# Patient Record
Sex: Female | Born: 1945 | Race: Black or African American | Hispanic: No | Marital: Married | Smoking: Never smoker
Health system: Southern US, Academic
[De-identification: ages and names within clinical notes are randomized; demographics above are authoritative.]

## PROBLEM LIST (undated history)

## (undated) DIAGNOSIS — I749 Embolism and thrombosis of unspecified artery: Secondary | ICD-10-CM

## (undated) DIAGNOSIS — I519 Heart disease, unspecified: Secondary | ICD-10-CM

## (undated) DIAGNOSIS — I10 Essential (primary) hypertension: Secondary | ICD-10-CM

## (undated) DIAGNOSIS — E119 Type 2 diabetes mellitus without complications: Secondary | ICD-10-CM

## (undated) DIAGNOSIS — E079 Disorder of thyroid, unspecified: Secondary | ICD-10-CM

## (undated) HISTORY — DX: Heart disease, unspecified: I51.9

## (undated) HISTORY — DX: Embolism and thrombosis of unspecified artery (CMS HCC): I74.9

## (undated) HISTORY — DX: Essential (primary) hypertension: I10

## (undated) HISTORY — DX: Disorder of thyroid, unspecified: E07.9

## (undated) HISTORY — DX: Type 2 diabetes mellitus without complications (CMS HCC): E11.9

## (undated) HISTORY — PX: HX HERNIA REPAIR: SHX51

---

## 2010-07-15 ENCOUNTER — Other Ambulatory Visit (HOSPITAL_COMMUNITY): Payer: Self-pay | Admitting: PHYSICIAN ASSISTANT

## 2021-05-11 NOTE — H&P (Unsigned)
Department of Hematology/Oncology  History and Physical    Name: Anita Edwards  SWN:[id number]  Date of Birth: 1945/05/07  Encounter Date: 05/13/2021    REFERRING PROVIDER:  Carmell Austria, PA  [address]  [address]  [place],  [place] [address]    REASON FOR OFFICE VISIT:  A patient previously seen a [facility] for evaluation and management of  History of Pulmonary Embolism.    HISTORY OF PRESENT ILLNESS:  Anita Edwards is a 76 y.o. female who presents today for follow up for hypercoagulable state. The patient was hospitalized in February 2021 with COVID pneumonia and suffered bilateral PE's. She was placed on Eliquis but was later switched to Xarelto due to insurance coverage. 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  The patient has had a good appetite and stable weight.  The patient has not noted any new areas of disease on own self exam.  The patient has not had any chest pain or dyspnea.  The patient has not had any headaches or changes in vision.  The patient has not had any abdominal pain, nausea, or vomiting.  There have been no changes in bowel or bladder habits.  The patient has not had any abnormal bleeding or clotting episodes.  There have been no complaints of fever, chills, cough, sputum production, dysuria, or diarrhea.    ROS:   Review of Systems - Oncology     History:  No past medical history on file.  {Medical History Negative:25850}      {Past Surgical History Negative:25851}        Social History     Socioeconomic History   . Marital status: Not on file     Spouse name: Not on file   . Number of children: Not on file   . Years of education: Not on file   . Highest education level: Not on file   Occupational History   . Not on file   Tobacco Use   . Smoking status: Not on file   . Smokeless tobacco: Not on file   Substance and Sexual Activity    . Alcohol use: Not on file   . Drug use: Not on file   . Sexual activity: Not on file   Other Topics Concern   . Not on file   Social History Narrative   . Not on file     Social Determinants of Health     Financial Resource Strain: Not on file   Transportation Needs: Not on file   Social Connections: Not on file   Intimate Partner Violence: Not on file   Housing Stability: Not on file       Social History     Social History Narrative   . Not on file       Social History     Substance and Sexual Activity   Drug Use Not on file       Family Medical History:    None           No current outpatient medications on file.       Not on File      PHYSICAL EXAM:    ECOG Status: {findings; ecog performance status:31780}   Physical Exam     LABS:   Labs completed on 08/04/20: WBC 4.5, HGB 11.8, HCT 36.1, PLT 182,000.  ASSESSMENT:  No diagnosis found.       PLAN:   1. All relative external and internal medical records were reviewed including available H&Ps, progress notes, procedure notes, imaging's, laboratories, and pathology.   2. All labs from last visit were reviewed with the patient including CBC/differential, CMP. Details of exam finding's discussed.       Chiann Goffredo Keng was given the chance to ask questions, and these were answered to their satisfaction. The patient is welcome to call with any questions or concerns in the meantime.     On the day of the encounter, a total of  *** minutes was spent on this patient encounter including review of historical information, examination, documentation and post-visit activities.   No follow-ups on file.     Benjaman Pott, APRN, FNP-BC    CC:  No primary provider on file.    Carosi, [place], [place]  [address]  [address]  [place],  [place] [address]      This note was partially generated using MModal Fluency Direct system, and there may be some incorrect words, spellings, and punctuation that were not noted in checking the note before saving.

## 2021-05-13 ENCOUNTER — Other Ambulatory Visit: Payer: Self-pay

## 2021-05-13 ENCOUNTER — Encounter (INDEPENDENT_AMBULATORY_CARE_PROVIDER_SITE_OTHER): Payer: Self-pay

## 2021-05-13 ENCOUNTER — Ambulatory Visit: Payer: Medicare (Managed Care) | Attending: NURSE PRACTITIONER | Admitting: NURSE PRACTITIONER

## 2021-05-13 ENCOUNTER — Encounter (HOSPITAL_COMMUNITY): Payer: Self-pay | Admitting: NURSE PRACTITIONER

## 2021-05-13 VITALS — BP 130/78 | HR 77 | Temp 97.4°F | Ht 59.0 in | Wt 176.8 lb

## 2021-05-13 DIAGNOSIS — Z09 Encounter for follow-up examination after completed treatment for conditions other than malignant neoplasm: Secondary | ICD-10-CM | POA: Insufficient documentation

## 2021-05-13 DIAGNOSIS — Z7901 Long term (current) use of anticoagulants: Secondary | ICD-10-CM | POA: Insufficient documentation

## 2021-05-13 DIAGNOSIS — Z86711 Personal history of pulmonary embolism: Secondary | ICD-10-CM | POA: Insufficient documentation

## 2021-12-21 ENCOUNTER — Inpatient Hospital Stay
Admission: RE | Admit: 2021-12-21 | Discharge: 2021-12-21 | Disposition: A | Discharge status: Home / Self Care | Payer: Medicare (Managed Care) | Source: Ambulatory Visit | Attending: PHYSICIAN ASSISTANT | Admitting: PHYSICIAN ASSISTANT

## 2021-12-21 ENCOUNTER — Other Ambulatory Visit: Payer: Self-pay

## 2021-12-21 ENCOUNTER — Other Ambulatory Visit (HOSPITAL_COMMUNITY): Payer: Self-pay | Admitting: PHYSICIAN ASSISTANT

## 2021-12-21 DIAGNOSIS — R52 Pain, unspecified: Secondary | ICD-10-CM

## 2022-02-02 ENCOUNTER — Encounter (HOSPITAL_COMMUNITY): Payer: Self-pay | Admitting: NURSE PRACTITIONER

## 2022-02-02 ENCOUNTER — Other Ambulatory Visit: Payer: Self-pay

## 2022-02-02 ENCOUNTER — Ambulatory Visit: Payer: Medicare (Managed Care) | Attending: NURSE PRACTITIONER | Admitting: NURSE PRACTITIONER

## 2022-02-02 VITALS — BP 138/70 | HR 74 | Temp 97.5°F | Ht 59.0 in | Wt 181.9 lb

## 2022-02-02 DIAGNOSIS — I82412 Acute embolism and thrombosis of left femoral vein: Secondary | ICD-10-CM | POA: Insufficient documentation

## 2022-02-02 DIAGNOSIS — Z7901 Long term (current) use of anticoagulants: Secondary | ICD-10-CM | POA: Insufficient documentation

## 2022-02-02 DIAGNOSIS — I82402 Acute embolism and thrombosis of unspecified deep veins of left lower extremity: Secondary | ICD-10-CM

## 2022-02-02 DIAGNOSIS — Z7984 Long term (current) use of oral hypoglycemic drugs: Secondary | ICD-10-CM | POA: Insufficient documentation

## 2022-02-02 DIAGNOSIS — I82432 Acute embolism and thrombosis of left popliteal vein: Secondary | ICD-10-CM | POA: Insufficient documentation

## 2022-02-02 DIAGNOSIS — Z794 Long term (current) use of insulin: Secondary | ICD-10-CM | POA: Insufficient documentation

## 2022-02-02 DIAGNOSIS — E119 Type 2 diabetes mellitus without complications: Secondary | ICD-10-CM | POA: Insufficient documentation

## 2022-02-02 DIAGNOSIS — Z86711 Personal history of pulmonary embolism: Secondary | ICD-10-CM | POA: Insufficient documentation

## 2022-02-02 NOTE — Cancer Center Note (Signed)
Department of Hematology/Oncology  History and Physical    Name: Anita Edwards  PYK:[id number]  Date of Birth: 10-17-1945  Encounter Date: 02/02/2022    REFERRING PROVIDER:  Carmell Austria, PA  [address]  [address]  [place],  [place] [address]    REASON FOR OFFICE VISIT:  A patient previously seen a [facility] for evaluation and management of  History of Pulmonary Embolism.    HISTORY OF PRESENT ILLNESS:  Anita Edwards is a 76 y.o. female who presents today for follow up for hypercoagulable state. The patient was hospitalized in February 2021 with COVID pneumonia and suffered bilateral PE's. She was placed on Eliquis but was later switched to Xarelto due to insurance coverage. 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    05/13/2021: The patient states that she has been doing well and would like to stop taking her Xarelto at this time. She was instructed on the labs findings of the initial hypercoagulable state workup and that this along with her history of PE could increase her chances of having another blood clot. She states that she would like to come off the Xarelto and start aspirin daily at this time. She was instructed on other circumstances that could cause her to be at increase risk of blood clots and the symptoms to watch for.     02/02/2022: Patient followed up with her PCP 12/2021 after having let leg pain and swelling. Labs were obtained and revealed an elevated D-dimer. She was sent for a Venous Duplex on 12/21/21 which showed a DVT of left proximal superficial femoral vein and popliteal veins. She was started on Eliquis 5 mg BID.    ROS:   Review of Systems   Constitutional: Negative.  Negative for appetite change.   HENT:  Negative.     Eyes: Negative.    Respiratory: Negative.  Negative for shortness of breath.    Cardiovascular: Negative.  Negative for chest pain.   Gastrointestinal: Negative.   Negative for abdominal pain, diarrhea, nausea and vomiting.   Genitourinary: Negative.  Negative for difficulty urinating.    Musculoskeletal: Negative.    Skin: Negative.    Neurological: Negative.    Hematological: Negative.    Psychiatric/Behavioral: Negative.          History:  Past Medical History:   Diagnosis Date    Diabetes mellitus, type 2 (CMS HCC)     Disorder of thyroid     Embolism (CMS HCC)     Essential hypertension     Heart disease        Social History     Socioeconomic History    Marital status: Married     Spouse name: Not on file    Number of children: Not on file    Years of education: Not on file    Highest education level: Not on file   Occupational History    Not on file   Tobacco Use    Smoking status: Never    Smokeless tobacco: Never   Vaping Use    Vaping Use: Never used   Substance and Sexual Activity    Alcohol use: Never    Drug use: Never    Sexual activity: Not on file   Other Topics Concern    Not on file   Social History Narrative    Not on file     Social Determinants  of Health     Financial Resource Strain: Not on file   Transportation Needs: Not on file   Social Connections: Not on file   Intimate Partner Violence: Not on file   Housing Stability: Not on file       Social History     Social History Narrative    Not on file       Social History     Substance and Sexual Activity   Drug Use Never       Family Medical History:       Problem Relation (Age of Onset)    COPD Father    Diabetes Mother, Father    Heart Disease Mother          Current Outpatient Medications   Medication Sig    ascorbate calc/ascorbyl palm (EASY-C IMMUNE HEALTH ORAL) Take by mouth    Ca ph tri/vitamin D3/soybean (SOY CARE BONE HEALTH ORAL) Take by mouth    cholecalciferol, vitamin D3, 25 mcg (1,000 unit) Oral Tablet Take 1 Tablet (1,000 Units total) by mouth Once a day    cyanocobalamin (VITAMIN B 12) 1,000 mcg Oral Tablet Take 1 Tablet (1,000 mcg total) by mouth Once a day    ELIQUIS 5 mg Oral Tablet      glimepiride (AMARYL) 2 mg Oral Tablet Take 1 Tablet (2 mg total) by mouth Twice daily    hydroCHLOROthiazide (HYDRODIURIL) 25 mg Oral Tablet Take 1 Tablet (25 mg total) by mouth Once a day    insulin glargine 100 unit/mL Subcutaneous injection (vial)     latanoprost (XALATAN) 0.005 % Ophthalmic Drops Instill 1 Drop into both eyes Every evening    levothyroxine (SYNTHROID) 50 mcg Oral Tablet Take 1 Tablet (50 mcg total) by mouth Once a day    lisinopriL (PRINIVIL) 10 mg Oral Tablet Take 1 Tablet (10 mg total) by mouth Once a day    metFORMIN (GLUCOPHAGE) 500 mg Oral Tablet Take 2 Tablets (1,000 mg total) by mouth Twice daily    metoprolol succinate (TOPROL-XL) 25 mg Oral Tablet Sustained Release 24 hr Take 1 Tablet (25 mg total) by mouth Once a day    simvastatin (ZOCOR) 20 mg Oral Tablet Take 1 Tablet (20 mg total) by mouth Once a day       No Known Allergies      PHYSICAL EXAM:    ECOG Status: (2) Ambulatory and capable of self care, unable to carry out work activity, up and about > 50% or waking hours   Physical Exam  Vitals and nursing note reviewed.   Constitutional:       Appearance: Normal appearance.   HENT:      Head: Normocephalic.      Nose: Nose normal.      Mouth/Throat:      Mouth: Mucous membranes are moist.      Pharynx: Oropharynx is clear.   Eyes:      General: No scleral icterus.     Extraocular Movements: Extraocular movements intact.   Cardiovascular:      Rate and Rhythm: Normal rate and regular rhythm.      Pulses: Normal pulses.      Heart sounds: Normal heart sounds.   Pulmonary:      Effort: Pulmonary effort is normal.      Breath sounds: Normal breath sounds.   Abdominal:      General: Bowel sounds are normal.      Palpations: Abdomen is soft.   Musculoskeletal:  General: Normal range of motion.      Cervical back: Normal range of motion and neck supple.   Skin:     General: Skin is warm and dry.   Neurological:      General: No focal deficit present.      Mental Status: She is alert  and oriented to person, place, and time. Mental status is at baseline.   Psychiatric:         Mood and Affect: Mood normal.         Behavior: Behavior normal.          LABS:   Labs completed on 08/04/20: WBC 4.5, HGB 11.8, HCT 36.1, PLT 182,000.    ASSESSMENT:    ICD-10-CM    1. Deep vein thrombosis (DVT) of left lower extremity (CMS HCC)  I82.402       2. History of pulmonary embolus (PE)  Z86.711            PLAN:   1. All relative external and internal medical records were reviewed including available H&Ps, progress notes, procedure notes, imaging's, laboratories, and pathology.   2. All labs from last studies were reviewed with the patient including CBC/differential, CMP. Details of exam finding's discussed. She will continue to have her labs completed by her PCP.   3. Patient has requested to see if she can get assistance to help pay for her Eliquis.       Ronasia Isola Hlavac was given the chance to ask questions, and these were answered to their satisfaction. The patient is welcome to call with any questions or concerns in the meantime.     On the day of the encounter, a total of  30 minutes was spent on this patient encounter including review of historical information, examination, documentation and post-visit activities.   Return in about 3 months (around 05/04/2022).   Lyna Laningham, APRN,FNP-BC ,02/02/2022 ,14:54     CC:  Jamie Carosi, PA  [address]  [place] [address]    Carosi, [place], [place]  [address]  [address]  [place],  [place] [address]      This note was partially generated using MModal Fluency Direct system, and there may be some incorrect words, spellings, and punctuation that were not noted in checking the note before saving.

## 2022-05-04 ENCOUNTER — Encounter (HOSPITAL_COMMUNITY): Payer: Self-pay | Admitting: NURSE PRACTITIONER

## 2022-05-04 ENCOUNTER — Other Ambulatory Visit: Payer: Self-pay

## 2022-05-04 ENCOUNTER — Ambulatory Visit: Payer: Medicare (Managed Care) | Attending: NURSE PRACTITIONER | Admitting: NURSE PRACTITIONER

## 2022-05-04 VITALS — BP 125/60 | HR 79 | Temp 98.5°F | Ht 59.0 in | Wt 181.9 lb

## 2022-05-04 DIAGNOSIS — I82402 Acute embolism and thrombosis of unspecified deep veins of left lower extremity: Secondary | ICD-10-CM | POA: Insufficient documentation

## 2022-05-04 DIAGNOSIS — Z86711 Personal history of pulmonary embolism: Secondary | ICD-10-CM | POA: Insufficient documentation

## 2022-05-04 DIAGNOSIS — D6859 Other primary thrombophilia: Secondary | ICD-10-CM | POA: Insufficient documentation

## 2022-05-04 DIAGNOSIS — Z7901 Long term (current) use of anticoagulants: Secondary | ICD-10-CM | POA: Insufficient documentation

## 2022-05-04 NOTE — Cancer Center Note (Signed)
Department of Hematology/Oncology  History and Physical    Name: Anita Edwards  [id number]  Date of Birth: 19-Sep-1945  Encounter Date: 05/04/2022    REFERRING PROVIDER:  Bary Leriche, [place]  [place],  [place] [address]    REASON FOR OFFICE VISIT:  A patient previously seen a [facility] for evaluation and management of  History of Pulmonary Embolism.    HISTORY OF PRESENT ILLNESS:  Anita Edwards is a 77 y.o. female who presents today for follow up for hypercoagulable state. The patient was hospitalized in February 2021 with COVID pneumonia and suffered bilateral PE's. She was placed on Eliquis but was later switched to Xarelto due to insurance coverage. 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    05/13/2021: The patient states that she has been doing well and would like to stop taking her Xarelto at this time. She was instructed on the labs findings of the initial hypercoagulable state workup and that this along with her history of PE could increase her chances of having another blood clot. She states that she would like to come off the Xarelto and start aspirin daily at this time. She was instructed on other circumstances that could cause her to be at increase risk of blood clots and the symptoms to watch for.     02/02/2022: Patient followed up with her PCP 12/2021 after having let leg pain and swelling. Labs were obtained and revealed an elevated D-dimer. She was sent for a Venous Duplex on 12/21/21 which showed a DVT of left proximal superficial femoral vein and popliteal veins. She was started on Eliquis 5 mg BID.    ROS:   Review of Systems   Constitutional: Negative.  Negative for appetite change.   HENT:  Negative.     Eyes: Negative.    Respiratory: Negative.  Negative for shortness of breath.    Cardiovascular: Negative.  Negative for chest pain.   Gastrointestinal: Negative.   Negative for abdominal pain, diarrhea, nausea and vomiting.   Genitourinary: Negative.  Negative for difficulty urinating.    Musculoskeletal: Negative.    Skin: Negative.    Neurological: Negative.    Hematological: Negative.    Psychiatric/Behavioral: Negative.          History:  Past Medical History:   Diagnosis Date    Diabetes mellitus, type 2 (CMS HCC)     Disorder of thyroid     Embolism (CMS HCC)     Essential hypertension     Heart disease        Social History     Socioeconomic History    Marital status: Married     Spouse name: Not on file    Number of children: Not on file    Years of education: Not on file    Highest education level: Not on file   Occupational History    Not on file   Tobacco Use    Smoking status: Never    Smokeless tobacco: Never   Vaping Use    Vaping status: Never Used   Substance and Sexual Activity    Alcohol use: Never    Drug use: Never    Sexual activity: Not on file   Other Topics Concern    Not on file   Social History Narrative    Not on file     Social Determinants  of Health     Financial Resource Strain: Not on file   Transportation Needs: Not on file   Social Connections: Not on file   Intimate Partner Violence: Not on file   Housing Stability: Not on file       Social History     Social History Narrative    Not on file       Social History     Substance and Sexual Activity   Drug Use Never       Family Medical History:       Problem Relation (Age of Onset)    COPD Father    Diabetes Mother, Father    Heart Disease Mother          Current Outpatient Medications   Medication Sig    alendronate (FOSAMAX) 70 mg Oral Tablet     ascorbate calc/ascorbyl palm (EASY-C IMMUNE HEALTH ORAL) Take by mouth    Ca ph tri/vitamin D3/soybean (SOY CARE BONE HEALTH ORAL) Take by mouth    cholecalciferol, vitamin D3, 25 mcg (1,000 unit) Oral Tablet Take 1 Tablet (1,000 Units total) by mouth Once a day    cyanocobalamin (VITAMIN B 12) 1,000 mcg Oral Tablet Take 1 Tablet (1,000 mcg total) by  mouth Once a day    ELIQUIS 5 mg Oral Tablet     glimepiride (AMARYL) 2 mg Oral Tablet Take 1 Tablet (2 mg total) by mouth Twice daily    hydroCHLOROthiazide (HYDRODIURIL) 25 mg Oral Tablet Take 1 Tablet (25 mg total) by mouth Once a day    insulin glargine 100 unit/mL Subcutaneous injection (vial)     latanoprost (XALATAN) 0.005 % Ophthalmic Drops Instill 1 Drop into both eyes Every evening    levothyroxine (SYNTHROID) 50 mcg Oral Tablet Take 1 Tablet (50 mcg total) by mouth Once a day    lisinopriL (PRINIVIL) 10 mg Oral Tablet Take 1 Tablet (10 mg total) by mouth Once a day    metFORMIN (GLUCOPHAGE) 500 mg Oral Tablet Take 2 Tablets (1,000 mg total) by mouth Twice daily    metoprolol succinate (TOPROL-XL) 25 mg Oral Tablet Sustained Release 24 hr Take 1 Tablet (25 mg total) by mouth Once a day    simvastatin (ZOCOR) 20 mg Oral Tablet Take 1 Tablet (20 mg total) by mouth Once a day       No Known Allergies      PHYSICAL EXAM:    ECOG Status: (2) Ambulatory and capable of self care, unable to carry out work activity, up and about > 50% or waking hours   Physical Exam  Vitals and nursing note reviewed.   Constitutional:       Appearance: Normal appearance.   HENT:      Head: Normocephalic.      Nose: Nose normal.      Mouth/Throat:      Mouth: Mucous membranes are moist.      Pharynx: Oropharynx is clear.   Eyes:      General: No scleral icterus.     Extraocular Movements: Extraocular movements intact.   Cardiovascular:      Rate and Rhythm: Normal rate and regular rhythm.      Pulses: Normal pulses.      Heart sounds: Normal heart sounds.   Pulmonary:      Effort: Pulmonary effort is normal.      Breath sounds: Normal breath sounds.   Abdominal:      General: Bowel sounds are normal.  Palpations: Abdomen is soft.   Musculoskeletal:         General: Normal range of motion.      Cervical back: Normal range of motion and neck supple.   Skin:     General: Skin is warm and dry.   Neurological:      General: No focal  deficit present.      Mental Status: She is alert and oriented to person, place, and time. Mental status is at baseline.   Psychiatric:         Mood and Affect: Mood normal.         Behavior: Behavior normal.          LABS:                 ASSESSMENT:    ICD-10-CM    1. History of pulmonary embolus (PE)  Z86.711       2. Deep vein thrombosis (DVT) of left lower extremity (CMS HCC)  I82.402              PLAN:   The patient will continue to have her labs completed by her PCP.  Patient will continue her Eliquis as ordered. She will follow up in 3 months or sooner if needed.       Ishea Oppliger Kalmar was given the chance to ask questions, and these were answered to their satisfaction. The patient is welcome to call with any questions or concerns in the meantime.     On the day of the encounter, a total of  26 minutes was spent on this patient encounter including review of historical information, examination, documentation and post-visit activities.   Return in about 3 months (around 08/04/2022).   Rylend Pietrzak, APRN,FNP-BC ,05/04/2022 ,14:15     CC:  Jamie Carosi, [place]  [address]  [place] [address]    Carosi, [place], [place]  [place],  [place] [address]      This note was partially generated using MModal Fluency Direct system, and there may be some incorrect words, spellings, and punctuation that were not noted in checking the note before saving.

## 2022-08-16 ENCOUNTER — Telehealth (INDEPENDENT_AMBULATORY_CARE_PROVIDER_SITE_OTHER): Payer: Self-pay | Admitting: NURSE PRACTITIONER

## 2022-08-16 NOTE — Telephone Encounter (Signed)
Called patient and rescheduled appt to 7-30 8:30 lab and  9:00 doctor.

## 2022-08-17 ENCOUNTER — Ambulatory Visit (INDEPENDENT_AMBULATORY_CARE_PROVIDER_SITE_OTHER): Payer: Self-pay

## 2022-08-17 ENCOUNTER — Ambulatory Visit (INDEPENDENT_AMBULATORY_CARE_PROVIDER_SITE_OTHER): Payer: Self-pay | Admitting: NURSE PRACTITIONER

## 2022-09-06 NOTE — Cancer Center Note (Signed)
Department of Hematology/Oncology  History and Physical    Name: Anita Edwards  ZOX:[id number]  Date of Birth: 17-Aug-1945  Encounter Date: 09/07/2022    REFERRING PROVIDER:  Carmell Austria, PA  [address]  [address]  [place],  [place] [address]    REASON FOR OFFICE VISIT:  Follow up for evaluation and management of  History of Pulmonary Embolism.    HISTORY OF PRESENT ILLNESS:  Anita Edwards is a 77 y.o. female who presents today for follow up for hypercoagulable state. The patient was hospitalized in February 2021 with COVID pneumonia and suffered bilateral PE's. She was placed on Eliquis but was later switched to Xarelto due to insurance coverage. 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    05/13/2021: The patient states that she has been doing well and would like to stop taking her Xarelto at this time. She was instructed on the labs findings of the initial hypercoagulable state workup and that this along with her history of PE could increase her chances of having another blood clot. She states that she would like to come off the Xarelto and start aspirin daily at this time. She was instructed on other circumstances that could cause her to be at increase risk of blood clots and the symptoms to watch for.     02/02/2022: Patient followed up with her PCP 12/2021 after having let leg pain and swelling. Labs were obtained and revealed an elevated D-dimer. She was sent for a Venous Duplex on 12/21/21 which showed a DVT of left proximal superficial femoral vein and popliteal veins. She was started on Eliquis 5 mg BID.    09/07/2022: The patient is here today for follow up for his hypercoagulable state. She continues on Eliquis 5 mg BID ad tolerating this well. The patient states that she is having some increased weakness in both lower extremities. She denies any other complaints or issues at this time. She is having her Eliquis filled  and labs completed by her PCP and has asked if she can just follow up with them. We have helped fill out free drug paperwork for her Eliquis and they have asked about this since they have not heard anything from the company. She is agreeable to following up on a yearly basis and will notify us if needs to be seen sooner.     ROS:   Review of Systems   Constitutional: Negative.  Negative for appetite change.   HENT:  Negative.     Eyes: Negative.    Respiratory: Negative.  Negative for shortness of breath.    Cardiovascular: Negative.  Negative for chest pain.   Gastrointestinal: Negative.  Negative for abdominal pain, diarrhea, nausea and vomiting.   Genitourinary: Negative.  Negative for difficulty urinating.    Skin: Negative.    Neurological:  Positive for extremity weakness (bilateral lower extremities).   Hematological: Negative.    Psychiatric/Behavioral: Negative.          History:  Past Medical History:   Diagnosis Date    Diabetes mellitus, type 2 (CMS HCC)     Disorder of thyroid     Embolism (CMS HCC)     Essential hypertension     Heart disease        Social History     Socioeconomic History    Marital status: Married     Spouse name: Not on file  Number of children: Not on file    Years of education: Not on file    Highest education level: Not on file   Occupational History    Not on file   Tobacco Use    Smoking status: Never    Smokeless tobacco: Never   Vaping Use    Vaping status: Never Used   Substance and Sexual Activity    Alcohol use: Never    Drug use: Never    Sexual activity: Not on file   Other Topics Concern    Not on file   Social History Narrative    Not on file     Social Determinants of Health     Financial Resource Strain: Not on file   Transportation Needs: Not on file   Social Connections: Not on file   Intimate Partner Violence: Not on file   Housing Stability: Not on file       Social History     Social History Narrative    Not on file       Social History     Substance and  Sexual Activity   Drug Use Never       Family Medical History:       Problem Relation (Age of Onset)    COPD Father    Diabetes Mother, Father    Heart Disease Mother          Current Outpatient Medications   Medication Sig    alendronate (FOSAMAX) 70 mg Oral Tablet     ascorbate calc/ascorbyl palm (EASY-C IMMUNE HEALTH ORAL) Take by mouth    Ca ph tri/vitamin D3/soybean (SOY CARE BONE HEALTH ORAL) Take by mouth    cholecalciferol, vitamin D3, 25 mcg (1,000 unit) Oral Tablet Take 1 Tablet (1,000 Units total) by mouth Once a day    cyanocobalamin (VITAMIN B 12) 1,000 mcg Oral Tablet Take 1 Tablet (1,000 mcg total) by mouth Once a day    ELIQUIS 5 mg Oral Tablet     glimepiride (AMARYL) 2 mg Oral Tablet Take 1 Tablet (2 mg total) by mouth Twice daily    hydroCHLOROthiazide (HYDRODIURIL) 25 mg Oral Tablet Take 1 Tablet (25 mg total) by mouth Once a day    insulin glargine 100 unit/mL Subcutaneous injection (vial)     latanoprost (XALATAN) 0.005 % Ophthalmic Drops Instill 1 Drop into both eyes Every evening    levothyroxine (SYNTHROID) 50 mcg Oral Tablet Take 1 Tablet (50 mcg total) by mouth Once a day    lisinopriL (PRINIVIL) 10 mg Oral Tablet Take 1 Tablet (10 mg total) by mouth Once a day    metFORMIN (GLUCOPHAGE) 500 mg Oral Tablet Take 2 Tablets (1,000 mg total) by mouth Twice daily    metoprolol succinate (TOPROL-XL) 25 mg Oral Tablet Sustained Release 24 hr Take 1 Tablet (25 mg total) by mouth Once a day    simvastatin (ZOCOR) 20 mg Oral Tablet Take 1 Tablet (20 mg total) by mouth Once a day       No Known Allergies      PHYSICAL EXAM:    ECOG Status: (2) Ambulatory and capable of self care, unable to carry out work activity, up and about > 50% or waking hours   Physical Exam  Vitals and nursing note reviewed.   Constitutional:       Appearance: Normal appearance.   HENT:      Head: Normocephalic.      Nose: Nose normal.  Mouth/Throat:      Mouth: Mucous membranes are moist.      Pharynx: Oropharynx is clear.    Eyes:      General: No scleral icterus.     Extraocular Movements: Extraocular movements intact.   Cardiovascular:      Rate and Rhythm: Normal rate and regular rhythm.      Pulses: Normal pulses.      Heart sounds: Normal heart sounds.   Pulmonary:      Effort: Pulmonary effort is normal.      Breath sounds: Normal breath sounds.   Abdominal:      General: Bowel sounds are normal.      Palpations: Abdomen is soft.   Musculoskeletal:         General: Normal range of motion.      Cervical back: Normal range of motion and neck supple.   Skin:     General: Skin is warm and dry.   Neurological:      General: No focal deficit present.      Mental Status: She is alert and oriented to person, place, and time. Mental status is at baseline.   Psychiatric:         Mood and Affect: Mood normal.         Behavior: Behavior normal.          LABS:                     ASSESSMENT:    ICD-10-CM    1. History of pulmonary embolus (PE)  Z86.711       2. Deep vein thrombosis (DVT) of left lower extremity (CMS HCC)  I82.402                PLAN:   The patient will continue to have her labs completed by her PCP.  Patient will continue her Eliquis as ordered. She will follow up in 1 year or sooner if needed.       Shakeya Raudales Nan was given the chance to ask questions, and these were answered to their satisfaction. The patient is welcome to call with any questions or concerns in the meantime.     On the day of the encounter, a total of  30 minutes was spent on this patient encounter including review of historical information, examination, documentation and post-visit activities.   Return in about 1 year (around 09/07/2023).   Courteny Egler, APRN,FNP-BC ,09/07/2022 ,10:13     CC:  Jamie Carosi, PA  [address]  [place] [id number]    Carosi, [place], [place]  [address]  [address]  [place],  [place] [address]      This note was partially generated using MModal Fluency Direct system, and there may be some incorrect words, spellings,  and punctuation that were not noted in checking the note before saving.

## 2022-09-07 ENCOUNTER — Ambulatory Visit: Payer: Medicare (Managed Care) | Attending: NURSE PRACTITIONER | Admitting: NURSE PRACTITIONER

## 2022-09-07 ENCOUNTER — Inpatient Hospital Stay (INDEPENDENT_AMBULATORY_CARE_PROVIDER_SITE_OTHER): Admission: RE | Admit: 2022-09-07 | Payer: Medicare (Managed Care) | Source: Ambulatory Visit

## 2022-09-07 ENCOUNTER — Encounter (INDEPENDENT_AMBULATORY_CARE_PROVIDER_SITE_OTHER): Payer: Self-pay | Admitting: NURSE PRACTITIONER

## 2022-09-07 ENCOUNTER — Other Ambulatory Visit: Payer: Self-pay

## 2022-09-07 VITALS — BP 145/67 | HR 71 | Temp 98.0°F | Ht 59.0 in | Wt 179.2 lb

## 2022-09-07 DIAGNOSIS — Z7901 Long term (current) use of anticoagulants: Secondary | ICD-10-CM | POA: Insufficient documentation

## 2022-09-07 DIAGNOSIS — I82402 Acute embolism and thrombosis of unspecified deep veins of left lower extremity: Secondary | ICD-10-CM

## 2022-09-07 DIAGNOSIS — Z86718 Personal history of other venous thrombosis and embolism: Secondary | ICD-10-CM | POA: Insufficient documentation

## 2022-09-07 DIAGNOSIS — Z86711 Personal history of pulmonary embolism: Secondary | ICD-10-CM | POA: Insufficient documentation

## 2022-09-07 DIAGNOSIS — R531 Weakness: Secondary | ICD-10-CM | POA: Insufficient documentation

## 2022-09-07 DIAGNOSIS — Z09 Encounter for follow-up examination after completed treatment for conditions other than malignant neoplasm: Secondary | ICD-10-CM | POA: Insufficient documentation

## 2022-10-19 IMAGING — MR MRI LUMBAR SPINE WITHOUT CONTRAST
4 of 6 series · 29 of 48 positions shown · IV contrast (gadolinium)
Comparison: None available.

﻿EXAM:  [id number]   MRI LUMBAR SPINE WITHOUT CONTRAST
INDICATION: 76-year-old with chronic severe low back pain.  Bilateral hip pain.  No history of malignancy or back surgery.
TECHNIQUE: Multiplanar multisequential MRI of the lumbosacral spine was performed without gadolinium contrast.

[Series 5: T2 · sagittal · 4.0mm · 0.94mm/px · 6 of 13 slices shown (1 of 3)]
[im 1/13]
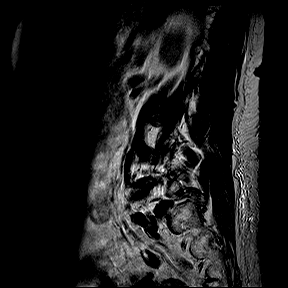
[im 3/13]
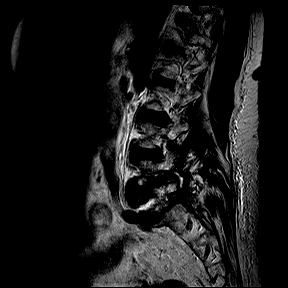
[im 5/13]
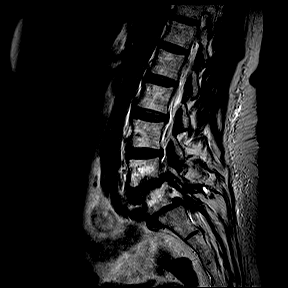
[im 8/13]
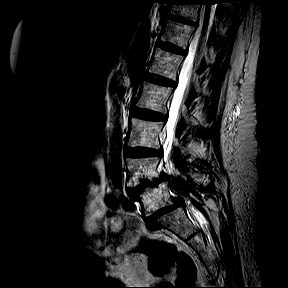
[im 10/13]
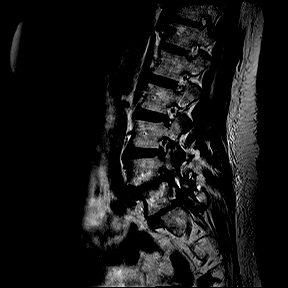
[im 13/13]
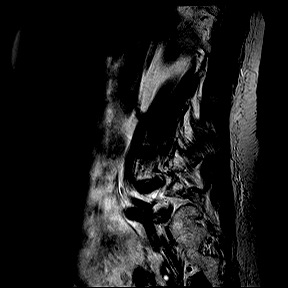

[Series 6: T1 · sagittal · 4.0mm · 0.94mm/px · 6 of 13 slices shown]
[im 1/13]
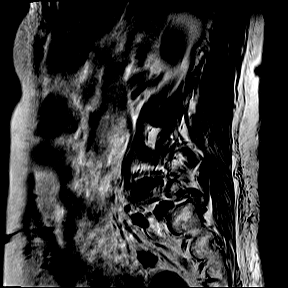
[im 3/13]
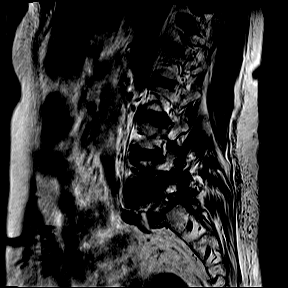
[im 5/13]
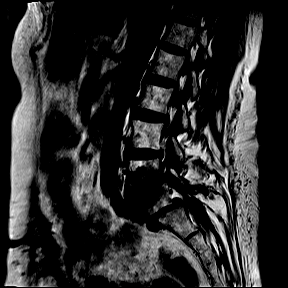
[im 8/13]
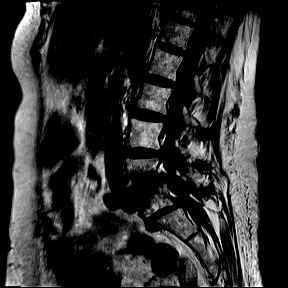
[im 10/13]
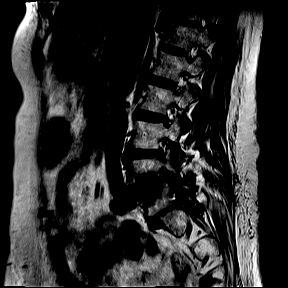
[im 13/13]
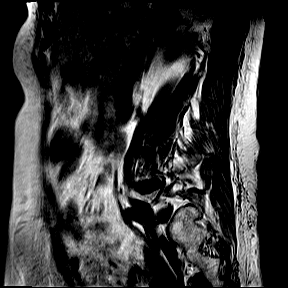

[Series 8: T2 · coronal · 5.0mm · 0.82mm/px · 9 of 18 slices shown (2 of 3)]
[im 1/18]
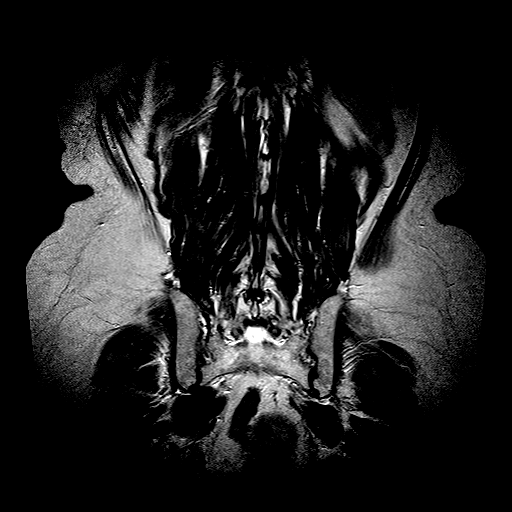
[im 3/18]
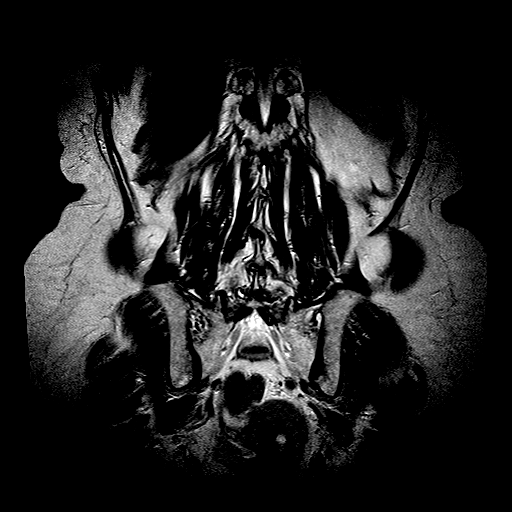
[im 5/18]
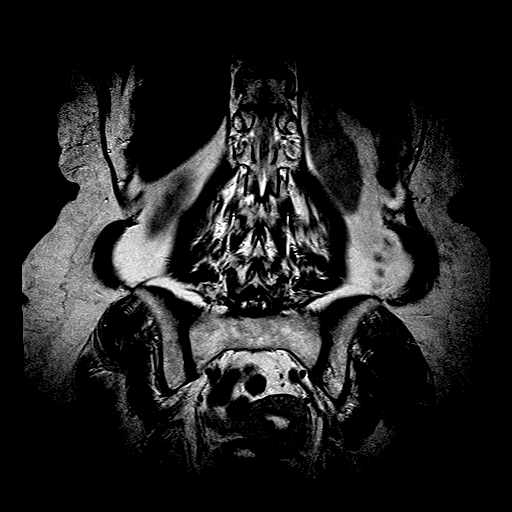
[im 7/18]
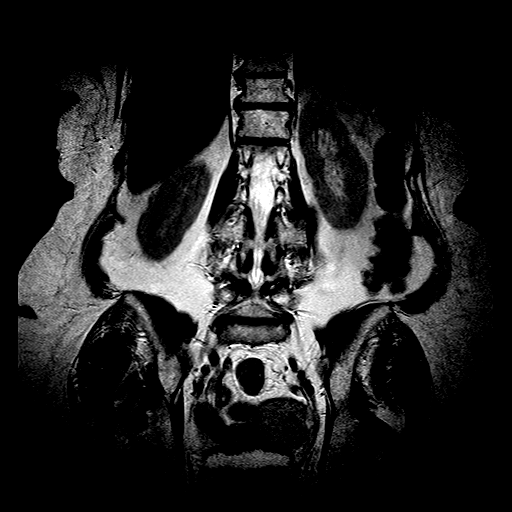
[im 9/18]
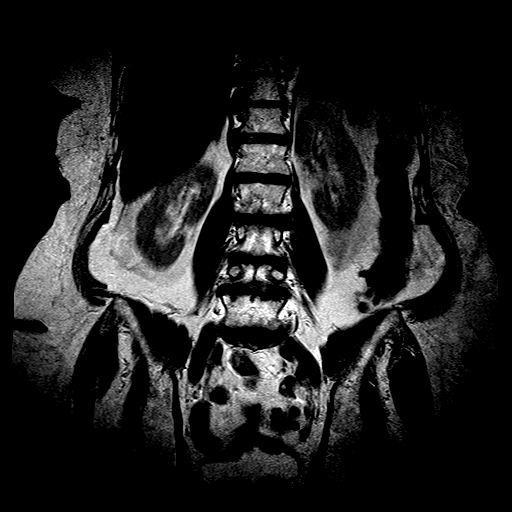
[im 11/18]
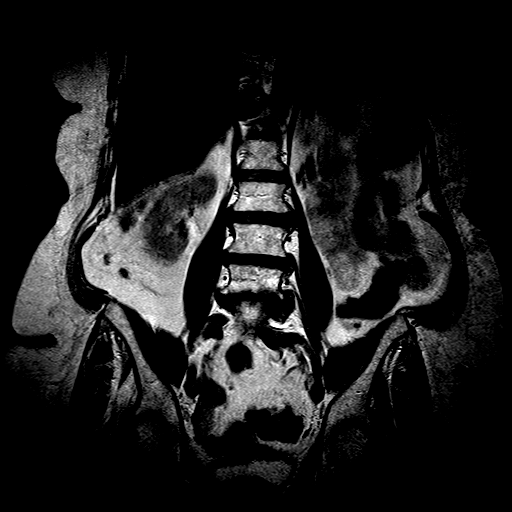
[im 13/18]
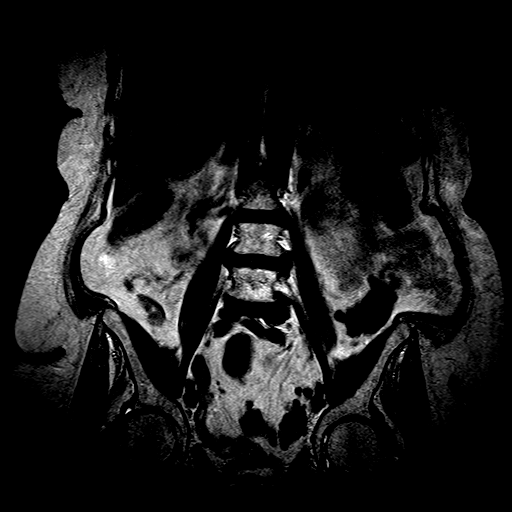
[im 15/18]
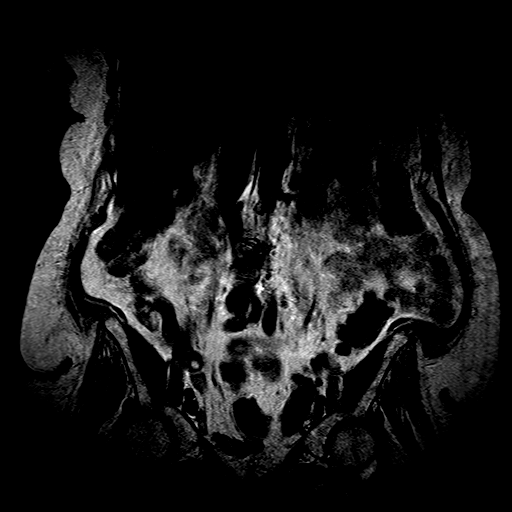
[im 18/18]
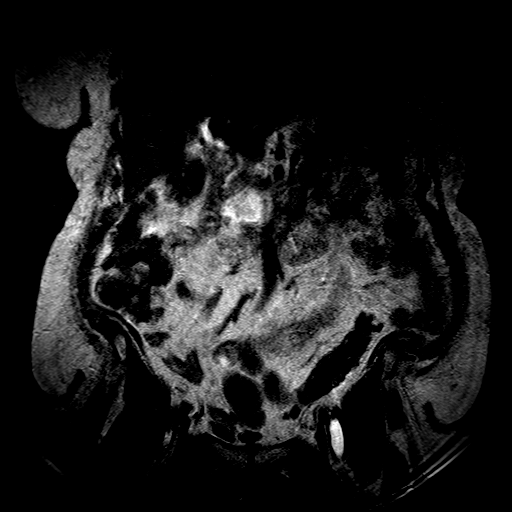

[Series 9: T2 · axial · 4.0mm · 0.52mm/px · z∈[-92,+141]mm · 8 of 19 slices shown (3 of 3)]
[im 1/19]
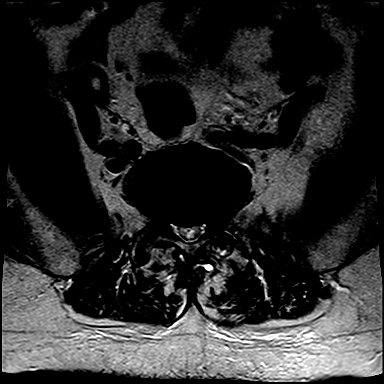
[im 3/19]
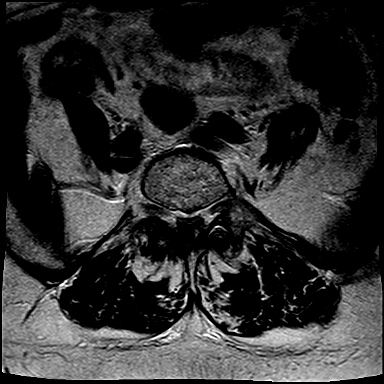
[im 7/19]
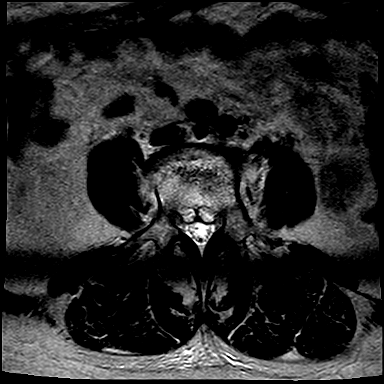
[im 9/19]
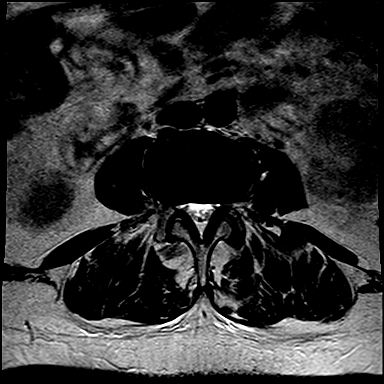
[im 11/19]
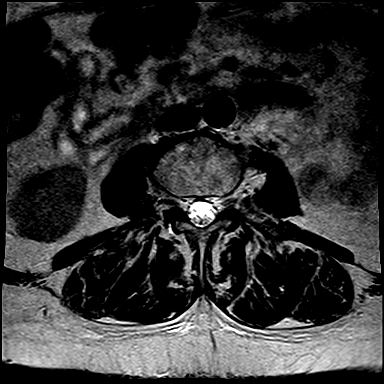
[im 13/19]
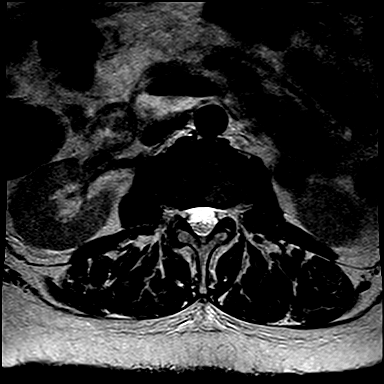
[im 17/19]
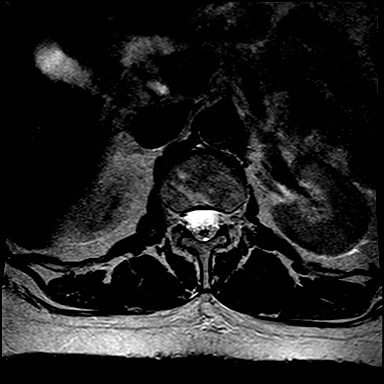
[im 19/19]
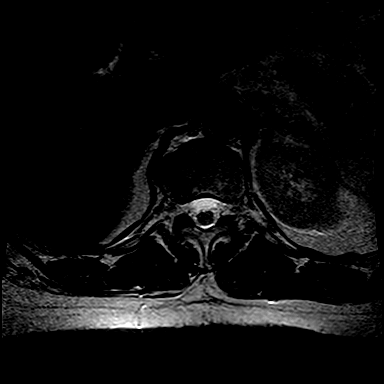

[29 of 48 positions shown; findings below may reference images not displayed]

FINDINGS: Transitional lumbosacral junction is noted with the lowest visible full disc in the sagittal projection labeled L5-S1 for the purposes of this report.

Severe Modic inflammatory changes on both sides of L4-5 disc is noted.  No other focal bone changes are seen. 

Conus terminates at L1-2 level. Cauda structures are normal. 

At L1-2 level, no focal disc lesions are seen.

At L2-3 level, mild degenerative disc changes and facet arthropathy are causing mild compromise of both lateral recess. 

At L3-4 level, degenerative disc disease and facet arthropathy of significant degree with hypertrophy of ligaments is causing moderately significant compromise of both lateral recess and neural foramina. Mild compromise of thecal sac in the midline with AP diameter measuring 10 mm.

At L4-5 level, severe bilateral facet arthropathy and severe degenerative disc disease is noted along with first-degree spondylolisthesis of L4 on L5.  Combination of these changes are causing severe compromise of lateral recess and neural foramina on both sides at this level and significant compromise of thecal sac with AP diameter in the midline measuring 6.5 mm.  Severe compromise of thecal sac at mid L5 level is also noted due to dorsal ligament hypertrophy.

At L5-S1 level, degenerative disc disease and facet arthropathy are causing severe biforaminal narrowing.  Mild first-degree spondylolisthesis of L5 on S1 is noted.
IMPRESSION: 1. Transitional lumbosacral junction. Lowest full disc in the sagittal projection is labeled L5-S1 for the purposes of this report.  Any surgical procedure on this patient's lumbar spine must be preceded by confirmation of disc level.  Severe Modic inflammatory changes on both sides of L4-5 disc is noted.

2. At L4-5 level, severe bilateral facet arthropathy and severe degenerative disc disease is noted along with first-degree spondylolisthesis of L4 on L5.  Combination of these changes are causing severe compromise of lateral recess and neural foramina on both sides at this level and significant compromise of thecal sac with AP diameter in the midline measuring 6.5 mm.  Severe compromise of thecal sac at mid L5 level is also noted due to dorsal ligament hypertrophy.

3. At L5-S1 level, degenerative disc disease and facet arthropathy are causing severe biforaminal narrowing.  Mild first-degree spondylolisthesis of L5 on S1 is noted.

4. Findings at other disc levels are described above in detail.

## 2023-06-30 ENCOUNTER — Other Ambulatory Visit (HOSPITAL_COMMUNITY): Payer: Self-pay | Admitting: PHYSICIAN ASSISTANT

## 2023-06-30 DIAGNOSIS — Z1231 Encounter for screening mammogram for malignant neoplasm of breast: Secondary | ICD-10-CM

## 2023-09-08 ENCOUNTER — Ambulatory Visit (INDEPENDENT_AMBULATORY_CARE_PROVIDER_SITE_OTHER): Payer: Self-pay | Admitting: NURSE PRACTITIONER

## 2023-09-08 ENCOUNTER — Ambulatory Visit (INDEPENDENT_AMBULATORY_CARE_PROVIDER_SITE_OTHER): Payer: Self-pay
